# Patient Record
Sex: Male | Born: 1995 | Race: Black or African American | Hispanic: No | Marital: Single | Smoking: Never smoker
Health system: Southern US, Community
[De-identification: ages and names within clinical notes are randomized; demographics above are authoritative.]

## PROBLEM LIST (undated history)

## (undated) ENCOUNTER — Emergency Department (HOSPITAL_COMMUNITY): Admission: EM | Payer: Self-pay | Source: Home / Self Care

---
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
## 2018-12-09 ENCOUNTER — Other Ambulatory Visit: Payer: Self-pay

## 2018-12-09 ENCOUNTER — Encounter (HOSPITAL_COMMUNITY): Payer: Self-pay

## 2018-12-09 ENCOUNTER — Ambulatory Visit (HOSPITAL_COMMUNITY)
Admission: EM | Admit: 2018-12-09 | Discharge: 2018-12-09 | Disposition: A | Discharge status: Home / Self Care | Payer: Self-pay | Attending: Family Medicine | Admitting: Family Medicine

## 2018-12-09 DIAGNOSIS — J069 Acute upper respiratory infection, unspecified: Secondary | ICD-10-CM

## 2018-12-09 NOTE — ED Provider Notes (Signed)
  [facility]   [id number] 12/09/18 Arrival Time: 1617  ASSESSMENT & PLAN:  1. Viral upper respiratory tract infection    Symptoms have resolved. Work note provided as requested. May f/u with PCP or here as needed.  Reviewed expectations re: course of current medical issues. Questions answered. Outlined signs and symptoms indicating need for more acute intervention. Patient verbalized understanding. After Visit Summary given.   SUBJECTIVE: History from: patient.  Manuel Gardner is a 23 y.o. male who presents with reported mild cold symptoms that lasted a couple of days last week; missed work secondary to symptoms. Now resolved. Feeling better. When symptoms were present he reports nasal congestion, post-nasal drainage, and a mild dry cough; without sore throat. No wheezing/SOB. Fever: no. Overall normal PO intake without n/v. Known sick contacts: no. No specific or significant aggravating or alleviating factors reported. OTC treatment: none.  Social History   Tobacco Use  Smoking Status Never Smoker  Smokeless Tobacco Never Used    ROS: As per HPI.   OBJECTIVE:  Vitals:   12/09/18 1646  BP: 130/78  Pulse: 62  Temp: 98.2 F (36.8 C)  TempSrc: Oral  SpO2: 100%     General appearance: alert; appears fatigued HEENT: nasal congestion; clear runny nose; throat irritation secondary to post-nasal drainage Neck: supple without LAD CV: RRR Lungs: unlabored respirations, symmetrical air entry without wheezing; cough: absent Abd: soft Ext: no LE edema Skin: warm and dry Psychological: alert and cooperative; normal mood and affect   No Known Allergies  PMH: "Healthy"  Social History   Socioeconomic History  . Marital status: Single    Spouse name: Not on file  . Number of children: Not on file  . Years of education: Not on file  . Highest education level: Not on file  Occupational History  . Not on file  Social Needs  . Financial resource strain: Not on  file  . Food insecurity:    Worry: Not on file    Inability: Not on file  . Transportation needs:    Medical: Not on file    Non-medical: Not on file  Tobacco Use  . Smoking status: Never Smoker  . Smokeless tobacco: Never Used  Substance and Sexual Activity  . Alcohol use: No  . Drug use: No  . Sexual activity: Not on file  Lifestyle  . Physical activity:    Days per week: Not on file    Minutes per session: Not on file  . Stress: Not on file  Relationships  . Social connections:    Talks on phone: Not on file    Gets together: Not on file    Attends religious service: Not on file    Active member of club or organization: Not on file    Attends meetings of clubs or organizations: Not on file    Relationship status: Not on file  . Intimate partner violence:    Fear of current or ex partner: Not on file    Emotionally abused: Not on file    Physically abused: Not on file    Forced sexual activity: Not on file  Other Topics Concern  . Not on file  Social History Narrative  . Not on file           Mardella Layman, MD 12/10/18 [phone]

## 2018-12-09 NOTE — ED Triage Notes (Signed)
Patient presents to Urgent Care with need for a work note to return to work. Patient states he had a dry cough last week and no longer has one, his employer needs a note stating it is ok to return.
